# Patient Record
Sex: Female | Born: 1995 | Race: Black or African American | Hispanic: No | Marital: Single | Smoking: Never smoker
Health system: Southern US, Community
[De-identification: ages and names within clinical notes are randomized; demographics above are authoritative.]

## PROBLEM LIST (undated history)

## (undated) DIAGNOSIS — I10 Essential (primary) hypertension: Secondary | ICD-10-CM

---
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
## 2015-06-20 ENCOUNTER — Ambulatory Visit (INDEPENDENT_AMBULATORY_CARE_PROVIDER_SITE_OTHER): Payer: BLUE CROSS/BLUE SHIELD | Admitting: Physician Assistant

## 2015-06-20 VITALS — BP 126/74 | HR 81 | Temp 99.0°F | Resp 16 | Ht 69.0 in | Wt 305.0 lb

## 2015-06-20 DIAGNOSIS — J029 Acute pharyngitis, unspecified: Secondary | ICD-10-CM | POA: Diagnosis not present

## 2015-06-20 DIAGNOSIS — J069 Acute upper respiratory infection, unspecified: Secondary | ICD-10-CM | POA: Diagnosis not present

## 2015-06-20 LAB — POCT RAPID STREP A (OFFICE): Rapid Strep A Screen: NEGATIVE

## 2015-06-20 NOTE — Progress Notes (Signed)
[facility] [address], [place] [address] [phone]  Date:  06/20/2015   Name:  Christine Love   DOB:  [birth year]   MRN:  [id number]  PCP:  No PCP Per Patient    Chief Complaint: Sore Throat   History of Present Illness:  This is a 20 y.o. female who is presenting with "possible strep". Having sore throat and swollen lymph notes x 3 days. States she looked in the mirror this morning and had white patches on her right tonsil. Has had strep before but not very often. Denies nasal congestion, cough, fever, chills. Has been feeling nauseated.  Aggravating/alleviating factors: advil and helps the pain temporarily. History of asthma: yes but thinks she grew out of it. Hasn't had to use inhaler in over 2 years. History of env allergies: no Tobacco use: no Coworker was complaining of a sore throat recently. No other sick contacts.   Review of Systems:  Review of Systems See HPI   There are no active problems to display for this patient.   Prior to Admission medications   Medication Sig Start Date End Date Taking? Authorizing Provider  Etonogestrel (NEXPLANON [place]) Inject into the skin.   Yes Historical Provider, MD    No Known Allergies  History reviewed. No pertinent past surgical history.  Social History  Substance Use Topics  . Smoking status: Never Smoker   . Smokeless tobacco: None  . Alcohol Use: No    No family history on file.  Medication list has been reviewed and updated.  Physical Examination:  Physical Exam  Constitutional: She is oriented to person, place, and time. She appears well-developed and well-nourished. No distress.  HENT:  Head: Normocephalic and atraumatic.  Right Ear: Hearing, tympanic membrane, external ear and ear canal normal.  Left Ear: Hearing, external ear and ear canal normal.  Nose: Nose normal.  Mouth/Throat: Uvula is midline and mucous membranes are normal. Posterior oropharyngeal erythema present. No  oropharyngeal exudate, posterior oropharyngeal edema or tonsillar abscesses.  Left TM obstructed by cerumen Tonsil stone in right tonsil  Eyes: Conjunctivae and lids are normal. Right eye exhibits no discharge. Left eye exhibits no discharge. No scleral icterus.  Cardiovascular: Normal rate, regular rhythm, normal heart sounds and normal pulses.   No murmur heard. Pulmonary/Chest: Effort normal and breath sounds normal. No respiratory distress. She has no wheezes. She has no rhonchi. She has no rales.  Musculoskeletal: Normal range of motion.  Lymphadenopathy:       Head (right side): No submental, no submandibular and no tonsillar adenopathy present.       Head (left side): No submental, no submandibular and no tonsillar adenopathy present.    She has no cervical adenopathy.  Neurological: She is alert and oriented to person, place, and time.  Skin: Skin is warm, dry and intact. No lesion and no rash noted.  Psychiatric: She has a normal mood and affect. Her speech is normal and behavior is normal. Thought content normal.   BP 126/74 mmHg  Pulse 81  Temp(Src) 99 F (37.2 C)  Resp 16  Ht 5\' 9"  (1.753 m)  Wt 305 lb (138.347 kg)  BMI 45.02 kg/m2  SpO2 98%  Results for orders placed or performed in visit on 06/20/15  POCT rapid strep A  Result Value Ref Range   Rapid Strep A Screen Negative Negative   Assessment and Plan:  1. Viral URI 2. Sore throat Likely viral uri. Rapid strep negative. "white  patches" patient saw on tonsil likely a tonsil stone. Counseled on supportive care. Return in 1 week if symptoms do not improve or at any time if symptoms worsen.  - POCT rapid strep A - Culture, Group A Strep   [place] [employer]. Dyke Brackett, MHS [facility]  06/20/2015

## 2015-06-22 LAB — CULTURE, GROUP A STREP

## 2015-07-01 ENCOUNTER — Telehealth: Payer: Self-pay

## 2015-07-01 NOTE — Telephone Encounter (Signed)
Pt states she was diagnosed with strep and isn't getting any better, would like to speak with someone. Please call [phone]    [facility] [address]

## 2015-07-02 NOTE — Telephone Encounter (Signed)
Left message for pt to call back  °

## 2015-07-25 ENCOUNTER — Ambulatory Visit (INDEPENDENT_AMBULATORY_CARE_PROVIDER_SITE_OTHER): Payer: BLUE CROSS/BLUE SHIELD

## 2015-07-25 ENCOUNTER — Ambulatory Visit (INDEPENDENT_AMBULATORY_CARE_PROVIDER_SITE_OTHER): Payer: BLUE CROSS/BLUE SHIELD | Admitting: Physician Assistant

## 2015-07-25 VITALS — BP 138/88 | HR 72 | Temp 98.6°F | Resp 16 | Ht 69.0 in | Wt 305.8 lb

## 2015-07-25 DIAGNOSIS — R7611 Nonspecific reaction to tuberculin skin test without active tuberculosis: Secondary | ICD-10-CM

## 2015-07-25 NOTE — Patient Instructions (Addendum)
     IF you received an x-ray today, you will receive an invoice from [facility]. Please contact [facility] at [phone] with questions or concerns regarding your invoice.   IF you received labwork today, you will receive an invoice from [facility]. Please contact [facility] at [phone] with questions or concerns regarding your invoice.   Our billing staff will not be able to assist you with questions regarding bills from these companies.  You will be contacted with the lab results as soon as they are available. The fastest way to get your results is to activate your My Chart account. Instructions are located on the last page of this paperwork. If you have not heard from us regarding the results in 2 weeks, please contact this office.    Your CXR was normal.  Please let the practice that placed the PPD know that this was negative.  They will then decide what they would like to do at this time.

## 2015-07-25 NOTE — Progress Notes (Signed)
[facility] [address], [place] [address] [phone]  Date:  07/25/2015   Name:  Christine Love Grygiel   DOB:  19-Sep-1995   MRN:  [id number]  PCP:  No PCP Per Patient   Chief Complaint  Patient presents with  . Chest X-ray    for a positive TB test done at another clinic     History of Present Illness:  Christine Love Matherly is a 20 y.o. female patient who [facility].   Patient reports that she had a TB performed about 5 days ago, and advised to come here for an XR after positive reading by her health care employer of [employer].  She states that she has never had a positive TB.  She has never worked in a hospital, nursing home.  No living outside of the [place].  No exposure to a person with known TB.  She does exhibit some fatigue for years.  No unexplained cough or night sweats.  No hx of illegal drug use or transfusions.      There are no active problems to display for this patient.   Past Medical History  Diagnosis Date  . Hypertension     History reviewed. No pertinent past surgical history.  Social History  Substance Use Topics  . Smoking status: Never Smoker   . Smokeless tobacco: None  . Alcohol Use: No    History reviewed. No pertinent family history.  No Known Allergies  Medication list has been reviewed and updated.  Current Outpatient Prescriptions on File Prior to Visit  Medication Sig Dispense Refill  . Etonogestrel (NEXPLANON [place]) Inject into the skin.     No current facility-administered medications on file prior to visit.    ROS ROS otherwise unremarkable unless listed above.   Physical Examination: BP 138/88 mmHg  Pulse 72  Temp(Src) 98.6 F (37 C) (Oral)  Resp 16  Ht 5\' 9"  (1.753 m)  Wt 305 lb 12.8 oz (138.71 kg)  BMI 45.14 kg/m2  SpO2 98% Ideal Body Weight: Weight in (lb) to have BMI = 25: 168.9  Physical Exam  Constitutional: She is oriented to person, place, and time. She appears well-developed and  well-nourished. No distress.  HENT:  Head: Normocephalic and atraumatic.  Right Ear: External ear normal.  Left Ear: External ear normal.  Eyes: Conjunctivae and EOM are normal. Pupils are equal, round, and reactive to light.  Cardiovascular: Normal rate.   Pulmonary/Chest: Effort normal. No respiratory distress.  Neurological: She is alert and oriented to person, place, and time.  Skin: She is not diaphoretic.  Psychiatric: She has a normal mood and affect. Her behavior is normal.   Dg Chest 1 View  07/25/2015  CLINICAL DATA:  Positive PPD EXAM: CHEST 1 VIEW COMPARISON:  None. FINDINGS: The heart size and mediastinal contours are within normal limits. Both lungs are clear. The visualized skeletal structures are unremarkable. IMPRESSION: Normal frontal chest. Electronically Signed   By: Marnee SpringJonathon  Watts M.D.   On: 07/25/2015 15:30   Assessment and Plan: Christine Love Joerger is a 20 y.o. female who is here today for a positive TB reading. --she will submit this XR read to clinic that has performed this ppd.  i have instructed her that this may be cleared or they may [facility] for further eval or possible tx.  Positive PPD - Plan: DG Chest 1 View  Trena PlattStephanie English, PA-C [facility] 07/25/2015 3:02 PM

## 2015-12-31 ENCOUNTER — Ambulatory Visit: Payer: BLUE CROSS/BLUE SHIELD | Admitting: Family Medicine

## 2015-12-31 ENCOUNTER — Encounter: Payer: Self-pay | Admitting: Family Medicine

## 2015-12-31 VITALS — BP 118/84 | HR 105 | Temp 99.3°F | Resp 17 | Ht 69.0 in | Wt 300.0 lb

## 2015-12-31 DIAGNOSIS — B379 Candidiasis, unspecified: Secondary | ICD-10-CM

## 2015-12-31 DIAGNOSIS — F329 Major depressive disorder, single episode, unspecified: Secondary | ICD-10-CM

## 2015-12-31 DIAGNOSIS — F32A Depression, unspecified: Secondary | ICD-10-CM

## 2015-12-31 LAB — GLUCOSE, POCT (MANUAL RESULT ENTRY): POC Glucose: 141 mg/dl — AB (ref 70–99)

## 2015-12-31 MED ORDER — VENLAFAXINE HCL ER 37.5 MG PO CP24: ORAL_CAPSULE | ORAL | 0 refills | Status: DC

## 2015-12-31 NOTE — Patient Instructions (Addendum)
  I am starting you on a medicine called Effexor.  Take 1 pill a day for the next 3 days, then 2 pills at the same daily after that.  You should start to notice a difference in the next couple of days.  It takes 4-6 weeks to get the full effect.  You've already started the right things by coming in today and [facility] the [facility].    Come back in about 3 weeks to see how you're doing.  If you're starting to feel worse, or having thoughts about hurting yourself, don't wait and come back immediately or go to the ED.   IF you received an x-ray today, you will receive an invoice from [facility]. Please contact [facility] at [phone] with questions or concerns regarding your invoice.   IF you received labwork today, you will receive an invoice from [facility]. Please contact [facility] at [phone] with questions or concerns regarding your invoice.   Our billing staff will not be able to assist you with questions regarding bills from these companies.  You will be contacted with the lab results as soon as they are available. The fastest way to get your results is to activate your My Chart account. Instructions are located on the last page of this paperwork. If you have not heard from us regarding the results in 2 weeks, please contact this office.

## 2015-12-31 NOTE — Progress Notes (Addendum)
Christine Love is a 20 y.o. female who [facility] for depressoin:  1.  Depression:  Patient has longstanding history of the same.  Has noted for the past several weeks increasing symptoms of depression. She is having trouble sleeping at night. She is awaking with racing thoughts. She cries easily. There are times where she does not want to get out of bed. She started to miss classes. 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 Been with her mother who recommended she come in to be seen. Her mother recommended her to come in about a month ago and she tried to work through it on her own but was able to still feels daily symptoms of depression.  She was treated for depression as a child through middle school. She does not know the name of any of her medications. She is treated with counseling through middle school and high school. When she got to college she initially was doing better. She had a few episodes of depression during her freshman and sophomore year but was able to move through with these. However she feels that she has been unable to move her current episode.  She does have history of suicidal ideation without actual plan in the past. She has some passive death wish currently but no suicidal/homicidal ideations. No plan.  No history of mania now or in the past. No thyroid issues. She does state she has recurrent yeast infections for which she is treated at [facility]. She has been tested about a year and half ago for diabetes and was negative. However the yeast infections started in the past few months.  No polyuria, questionable polydipsia. Occasional nocturia.  No history of UTI/dysuria.    ROS as above.   PMH reviewed. Patient is a nonsmoker.   Past Medical History:  Diagnosis Date  . Anxiety   . Depression   . Irregular periods/menstrual cycles    No past surgical history on file.  Medications reviewed. Current  Outpatient Prescriptions  Medication Sig Dispense Refill  . ALPRAZolam (XANAX) 1 MG tablet Take 1 mg by mouth at bedtime as needed for anxiety.    Marland Kitchen amphetamine-dextroamphetamine (ADDERALL XR) 20 MG 24 hr capsule Take 20 mg by mouth 3 (three) times daily.     . Acetaminophen (TYLENOL PO) Take by mouth as needed.      . citalopram (CELEXA) 40 MG tablet Take 1 tablet (40 mg total) by mouth daily. (Patient not taking: Reported on 12/31/2015) 30 tablet 2  . HYDROcodone-acetaminophen (NORCO/VICODIN) 5-325 MG per tablet Take 1 tablet by mouth every 4 (four) hours as needed for pain. (Patient not taking: Reported on 12/31/2015) 20 tablet 0   No current facility-administered medications for this visit.      Physical Exam:  BP 110/60   Pulse 93   Temp 98.2 F (36.8 C) (Oral)   Resp 18   Ht 5\' 5"  (1.651 m)   Wt 149 lb 3.2 oz (67.7 kg)   LMP 12/17/2015   SpO2 100%   BMI 24.83 kg/m  Gen:  Alert, cooperative patient who appears stated age in no acute distress.  Vital signs reviewed. HEENT: EOMI,  MMM Pulm:  Clear to auscultation bilaterally with good air movement.  No wheezes or rales noted.   Cardiac:  Regular rate and rhythm without murmur auscultated.  Good S1/S2. Psych:  Flat affect. Cried throughout much of our visit.  No hallucinatoins.  No  SI/HI  Assessment and Plan:  1.  Depression: She has already [facility] She states her appointment is this coming Monday. She and her mother think she cannot wait until then and wanted to be seen by a doctor first. -We'll start her on Effexor 37.5 mg once a day for the next 3days and increase to 75 mg daiy after that. -Follow-up in about 2-3 weeks to ensure she is starting to notice some improvement with Effexor and that she is having any suicidal ideation. We can also see how she's doing with her counseling at that time - Encouraged her that she is doing the right thing by seeking treatment both with us and  counseling.    2.  Recurrent yeast infectoins: - none currently.  Checking for diabetes as this could also affect mood d/o.  - CBG was 141 today. She is not fasting and recently had something to eat about an hour ago. - Do not think she has diabetes.  No polyuria/polydipsia symptoms.

## 2016-02-13 ENCOUNTER — Other Ambulatory Visit: Payer: Self-pay | Admitting: Family Medicine

## 2016-03-17 ENCOUNTER — Telehealth: Payer: Self-pay

## 2016-03-17 NOTE — Telephone Encounter (Signed)
Patient called to request a copy of the venlafaxine prescription she got the last time she was here for documentation purposes for school.  Please advise  [phone]

## 2016-03-18 NOTE — Telephone Encounter (Signed)
Pt is calling a second time regarding a paper documentation for venlafaxine she was prescribed   Please advise [phone]

## 2016-03-19 NOTE — Telephone Encounter (Signed)
Spoke to patient. I gave a copy of ov note and snap shot FOR SCHOOL.

## 2016-04-20 ENCOUNTER — Telehealth: Payer: Self-pay

## 2016-04-20 NOTE — Telephone Encounter (Addendum)
PATIENT WAS SEEN IN September BY DR. Gwendolyn GrantWALDEN FOR DEPRESSION. SHE DROPPED OUT OF SCHOOL AND NOW SHE WILL BE GOING BACK. SHE CALLED TO GET A NOTE TO RETURN, BUT HER SCHOOL WOULD NOT EXCEPT AN ELECTRONIC SIGNATURE. SHE NEEDS THE NOTE TO SAY THAT SHE WAS SEEN FOR DEPRESSION AND WAS PRESCRIBED VENLAFAXINE 37.5 MG. SHE ALSO NEEDS IT TO SAY THAT IT IS ALRIGHT FOR HER TO RETURN SINCE BEING ON THE MEDICATION. ALSO SHE HAS NOTICED SOME THINGS WRONG ON HER AFTER VISIT SUMMARY THAT NEEDS TO BE CORRECTED. PLEASE CALL HER WHEN SHE CAN PICK THE NOTE UP. SHE NEEDS IT BEFORE SHE RETURNS TO SCHOOL ON April 26, 2016. SHE WILL ATTEND [employer]. (PATIENT MADE AN APPOINTMENT TO SEE DR. Gwendolyn GrantWALDEN ON Wednesday 04/21/2016.) BEST PHONE [phone] (CELL) MBC

## 2016-04-20 NOTE — Telephone Encounter (Signed)
Spoke with patient and she will address this letter at her visit tomorrow with Dr. Gwendolyn GrantWalden.

## 2016-04-21 ENCOUNTER — Ambulatory Visit (INDEPENDENT_AMBULATORY_CARE_PROVIDER_SITE_OTHER): Payer: BLUE CROSS/BLUE SHIELD | Admitting: Family Medicine

## 2016-04-21 VITALS — BP 142/77 | HR 82 | Temp 98.9°F | Resp 16 | Ht 69.0 in | Wt 301.0 lb

## 2016-04-21 DIAGNOSIS — F339 Major depressive disorder, recurrent, unspecified: Secondary | ICD-10-CM

## 2016-04-21 MED ORDER — VENLAFAXINE HCL ER 37.5 MG PO CP24: ORAL_CAPSULE | ORAL | 3 refills | Status: DC

## 2016-04-21 NOTE — Telephone Encounter (Signed)
I'll address at her visit today.  Thanks

## 2016-04-21 NOTE — Telephone Encounter (Signed)
Note written at visit today  See today's OV note.

## 2016-04-21 NOTE — Patient Instructions (Addendum)
It was good to see you again today!  I have refilled your medicine.  I have also written the letter for you.    Come back to see me in 4 - 6 weeks to see how you're doing after school starts.

## 2016-04-21 NOTE — Progress Notes (Signed)
   Christine Love is a 21 y.o. female who [facility] for FU for depression:  1.  Depression:  Patient was seen here in September for depression. She is a Consulting civil engineerstudent at [employer].  She was started on Effexor at that visit. She noted after several weeks of taking this a improvement in her mood disorder. She had more activity. She was walking and working out for exercise.  However she missed her follow-up appointment. She therefore had no more Effexor. Her mood disorder worsened. To the point where she actually dropped out of school for the second half of the semester. She was working and spending time with friends and family. This helped her mood. However she still has depressive symptoms on most days. She denies any suicidal or homicidal ideation. She has noted some gradual increase of her weight. This is somewhat stabilized because of her increased activity.  She would like to try to go back to school. She wants to be restarted on the venlaxafine. She is a program in place to start seeing counseling [facility].  ROS as above.    PMH reviewed. Patient is a nonsmoker.   Past Medical History:  Diagnosis Date  . Hypertension    No past surgical history on file.  Medications reviewed. Current Outpatient Prescriptions  Medication Sig Dispense Refill  . Etonogestrel (NEXPLANON [place]) Inject into the skin.    Marland Kitchen. venlafaxine XR (EFFEXOR XR) 37.5 MG 24 hr capsule Take 1 pill a day for the next 3 days, then 2 pills at the same daily after that. 60 capsule 0   No current facility-administered medications for this visit.      Physical Exam:  BP (!) 142/77 (BP Location: Right Arm, Patient Position: Sitting, Cuff Size: Large)   Pulse 82   Temp 98.9 F (37.2 C) (Oral)   Resp 16   Ht 5\' 9"  (1.753 m)   Wt (!) 301 lb (136.5 kg)   LMP 03/31/2016 (Approximate)   SpO2 100%   BMI 44.45 kg/m  Gen:  Alert, cooperative patient who appears stated age in no acute distress.  Vital  signs reviewed. Psych:  Somewhat flat affect. Somewhat depressed appearing. Linear and coherent thought process..   Assessment and Plan:  1.  Depression: -Refill for Effexor today. -I have written a note for herself that she can start school again. -Follow-up with us in 4-6 weeks to assess to see how she's doing. She should follow-up with her school counselor as previously planned.

## 2016-10-05 ENCOUNTER — Encounter (HOSPITAL_COMMUNITY): Payer: Self-pay | Admitting: Emergency Medicine

## 2016-10-05 ENCOUNTER — Emergency Department (HOSPITAL_COMMUNITY)
Admission: EM | Admit: 2016-10-05 | Discharge: 2016-10-05 | Disposition: A | Discharge status: Home / Self Care | Payer: BLUE CROSS/BLUE SHIELD | Attending: Emergency Medicine | Admitting: Emergency Medicine

## 2016-10-05 ENCOUNTER — Emergency Department (HOSPITAL_COMMUNITY): Payer: BLUE CROSS/BLUE SHIELD

## 2016-10-05 DIAGNOSIS — Y999 Unspecified external cause status: Secondary | ICD-10-CM | POA: Insufficient documentation

## 2016-10-05 DIAGNOSIS — S67194A Crushing injury of right ring finger, initial encounter: Secondary | ICD-10-CM | POA: Diagnosis not present

## 2016-10-05 DIAGNOSIS — W208XXA Other cause of strike by thrown, projected or falling object, initial encounter: Secondary | ICD-10-CM | POA: Insufficient documentation

## 2016-10-05 DIAGNOSIS — Y929 Unspecified place or not applicable: Secondary | ICD-10-CM | POA: Insufficient documentation

## 2016-10-05 DIAGNOSIS — S6991XA Unspecified injury of right wrist, hand and finger(s), initial encounter: Secondary | ICD-10-CM | POA: Diagnosis present

## 2016-10-05 DIAGNOSIS — S6710XA Crushing injury of unspecified finger(s), initial encounter: Secondary | ICD-10-CM

## 2016-10-05 DIAGNOSIS — Y939 Activity, unspecified: Secondary | ICD-10-CM | POA: Diagnosis not present

## 2016-10-05 DIAGNOSIS — I10 Essential (primary) hypertension: Secondary | ICD-10-CM | POA: Diagnosis not present

## 2016-10-05 NOTE — ED Notes (Signed)
Pt reports a can of cream of chicken fell on her R ring finger tonight.  Very mild swelling noted.  CMS intact

## 2016-10-05 NOTE — Discharge Instructions (Signed)
Ibuprofen or Tylenol for pain. Keep the splint on for 1-2 weeks as needed. Ice and elevate your fingers. Follow-up with family doctor as needed.

## 2016-10-05 NOTE — ED Provider Notes (Signed)
[facility]-EMERGENCY DEPT Provider Note   CSN: [id number] Arrival date & time: 10/05/16  1907     History   Chief Complaint Chief Complaint  Patient presents with  . Finger Injury    HPI Cintia Alexa is a 21 y.o. female.  HPI Eston EstersChyna Reisz is a 21 y.o. female presents to emergency public complaining of finger injury. Patient states she was getting some cans out of her cabinet when one fell hitting her in the right Ring finger. She reports pain with flexion and extension of the finger. She denies any numbness distally. No weakness with flexion extension. States it was swollen, but swelling has gone down. Denies any other injuries.  Past Medical History:  Diagnosis Date  . Hypertension     There are no active problems to display for this patient.   History reviewed. No pertinent surgical history.  OB History    No data available       Home Medications    Prior to Admission medications   Medication Sig Start Date End Date Taking? Authorizing Provider  Etonogestrel (NEXPLANON [place]) Inject into the skin.    [provider]  venlafaxine XR (EFFEXOR XR) 37.5 MG 24 hr capsule Take 1 tab po bid 04/21/16   Tobey GrimWalden, Jeffrey H, MD    Family History Family History  Problem Relation Age of Onset  . Hypertension Other     Social History Social History  Substance Use Topics  . Smoking status: Never Smoker  . Smokeless tobacco: Never Used  . Alcohol use Yes     Comment: social     Allergies   Patient has no known allergies.   Review of Systems Review of Systems  Musculoskeletal: Positive for arthralgias and joint swelling.  Neurological: Negative for weakness and numbness.  All other systems reviewed and are negative.    Physical Exam Updated Vital Signs BP 137/82 (BP Location: Left Arm)   Pulse 84   Temp 99.3 F (37.4 C) (Oral)   Resp 16   Ht 5\' 9"  (1.753 m)   Wt 136.1 kg (300 lb)   LMP 09/26/2016 (Approximate)   SpO2 99%   BMI 44.30 kg/m   Physical  Exam  Musculoskeletal:  Swelling noted to the right ring finger at the PIP joint. Full range of motion of the finger at each joint. Strength intact with flexion extension at each joint isolated. Distal cap refill is less than 2 seconds. Sensation is intact. Normal hand otherwise.  Nursing note and vitals reviewed.    ED Treatments / Results  Labs (all labs ordered are listed, but only abnormal results are displayed) Labs Reviewed - No data to display  EKG  EKG Interpretation None       Radiology Dg Finger Ring Right  Result Date: 10/05/2016 CLINICAL DATA:  21 y/o F; initial encounter. Crush injury with pain. EXAM: RIGHT RING FINGER 2+V COMPARISON:  None. FINDINGS: There is no evidence of fracture or dislocation. There is no evidence of arthropathy or other focal bone abnormality. Soft tissues are unremarkable. IMPRESSION: Negative. Electronically Signed   By: Mitzi HansenLance  Furusawa-Stratton M.D.   On: 10/05/2016 19:42    Procedures Procedures (including critical care time)  Medications Ordered in ED Medications - No data to display   Initial Impression / Assessment and Plan / ED Course  I have reviewed the triage vital signs and the nursing notes.  Pertinent labs & imaging results that were available during my care of the patient were reviewed by me  and considered in my medical decision making (see chart for details).     Patient with crush injury of the right fourth finger. X-ray negative. Finger splinted. Home with ice, elevation, ibuprofen, splint as needed, follow with family doctor. Return precautions discussed.  Vitals:   10/05/16 1919  BP: 137/82  Pulse: 84  Resp: 16  Temp: 99.3 F (37.4 C)     Final Clinical Impressions(s) / ED Diagnoses   Final diagnoses:  Crushing injury of finger, initial encounter    New Prescriptions New Prescriptions   No medications on file     Jaynie Crumble, Cordelia Poche 10/05/16 2134    Doug Sou, MD 10/06/16 [phone]

## 2016-10-05 NOTE — ED Triage Notes (Signed)
Pt states she opened a cabinet and a can of food fell out on her right ring finger Pt is c/o pain and states it hurts to try to straighten it

## 2016-10-05 NOTE — ED Notes (Signed)
Pt left without her d/c instructions and without signing.

## 2016-10-05 NOTE — ED Notes (Signed)
Splint and coban placed

## 2016-10-25 ENCOUNTER — Ambulatory Visit: Payer: BLUE CROSS/BLUE SHIELD | Admitting: Physician Assistant

## 2016-10-26 ENCOUNTER — Encounter: Payer: Self-pay | Admitting: Physician Assistant

## 2016-10-26 ENCOUNTER — Ambulatory Visit (INDEPENDENT_AMBULATORY_CARE_PROVIDER_SITE_OTHER): Payer: BLUE CROSS/BLUE SHIELD | Admitting: Physician Assistant

## 2016-10-26 VITALS — BP 134/83 | HR 76 | Temp 98.8°F | Resp 18 | Ht 69.57 in | Wt 303.2 lb

## 2016-10-26 DIAGNOSIS — H6122 Impacted cerumen, left ear: Secondary | ICD-10-CM

## 2016-10-26 NOTE — Patient Instructions (Signed)
pc    IF you received an x-ray today, you will receive an invoice from [facility]. Please contact [facility] at [phone] with questions or concerns regarding your invoice.   IF you received labwork today, you will receive an invoice from [facility]. Please contact [facility] at [phone] with questions or concerns regarding your invoice.   Our billing staff will not be able to assist you with questions regarding bills from these companies.  You will be contacted with the lab results as soon as they are available. The fastest way to get your results is to activate your My Chart account. Instructions are located on the last page of this paperwork. If you have not heard from us regarding the results in 2 weeks, please contact this office.     

## 2016-10-29 NOTE — Progress Notes (Signed)
[facility] [address], [place] [phone]  Date:  10/26/2016   Name:  Christine Love   DOB:  1995-10-16   MRN:  [id number]  PCP:  Patient, No Pcp Per    History of Present Illness:  Christine Love is a 21 y.o. female patient who presents to PCP with  Chief Complaint  Patient presents with  . Ear Pain    X 1 week- left ear     Patient notes 1 week of left ear pain. This has sensation of clogged and hard to hear.  She attempted to clean it out with her nail, but her symptoms worsened.    There are no active problems to display for this patient.   Past Medical History:  Diagnosis Date  . Hypertension     History reviewed. No pertinent surgical history.  Social History  Substance Use Topics  . Smoking status: Never Smoker  . Smokeless tobacco: Never Used  . Alcohol use Yes     Comment: social    Family History  Problem Relation Age of Onset  . Hypertension Other   . Hypertension Mother     No Known Allergies  Medication list has been reviewed and updated.  Current Outpatient Prescriptions on File Prior to Visit  Medication Sig Dispense Refill  . Etonogestrel (NEXPLANON [place]) Inject into the skin.    Marland Kitchen. venlafaxine XR (EFFEXOR XR) 37.5 MG 24 hr capsule Take 1 tab po bid 60 capsule 3   No current facility-administered medications on file prior to visit.     ROS ROS otherwise unremarkable unless listed above.  Physical Examination: BP 134/83 (BP Location: Right Arm, Patient Position: Sitting, Cuff Size: Large)   Pulse 76   Temp 98.8 F (37.1 C) (Oral)   Resp 18   Ht 5' 9.57" (1.767 m)   Wt (!) 303 lb 3.2 oz (137.5 kg)   LMP 09/26/2016 (Approximate)   SpO2 99%   BMI 44.05 kg/m  Ideal Body Weight: Weight in (lb) to have BMI = 25: 171.7  Physical Exam  Constitutional: She is oriented to person, place, and time. She appears well-developed and well-nourished. No distress.  HENT:  Right Ear: External ear normal.  Left Ear:  External ear normal.  Canal without erythema.  Tm blocked from visualization due to cerumen impaction. Once irrigation, tm is normal  Cardiovascular: Normal rate.   Pulmonary/Chest: Effort normal. No respiratory distress.  Neurological: She is alert and oriented to person, place, and time.  Skin: She is not diaphoretic.  Psychiatric: She has a normal mood and affect. Her behavior is normal.     Assessment and Plan: Christine EstersChyna Mchargue is a 21 y.o. female who is here today f0r cc of cerumen impaction Impacted cerumen of left ear  Trena PlattStephanie English, PA-C [facility] 7/13/20188:43 AM

## 2017-06-24 ENCOUNTER — Ambulatory Visit: Payer: BLUE CROSS/BLUE SHIELD | Admitting: Family Medicine

## 2017-07-19 ENCOUNTER — Encounter: Payer: Self-pay | Admitting: Physician Assistant

## 2017-09-02 ENCOUNTER — Encounter: Payer: Self-pay | Admitting: Family Medicine

## 2017-09-07 ENCOUNTER — Encounter: Payer: Self-pay | Admitting: Family Medicine

## 2018-07-21 IMAGING — CR DG FINGER RING 2+V*R*
3 series · 3 of 3 positions shown · non-contrast
Comparison: None.

CLINICAL DATA: 21 y/o F; initial encounter. Crush injury with pain.

EXAM:
RIGHT RING FINGER 2+V

[x finger pa right]
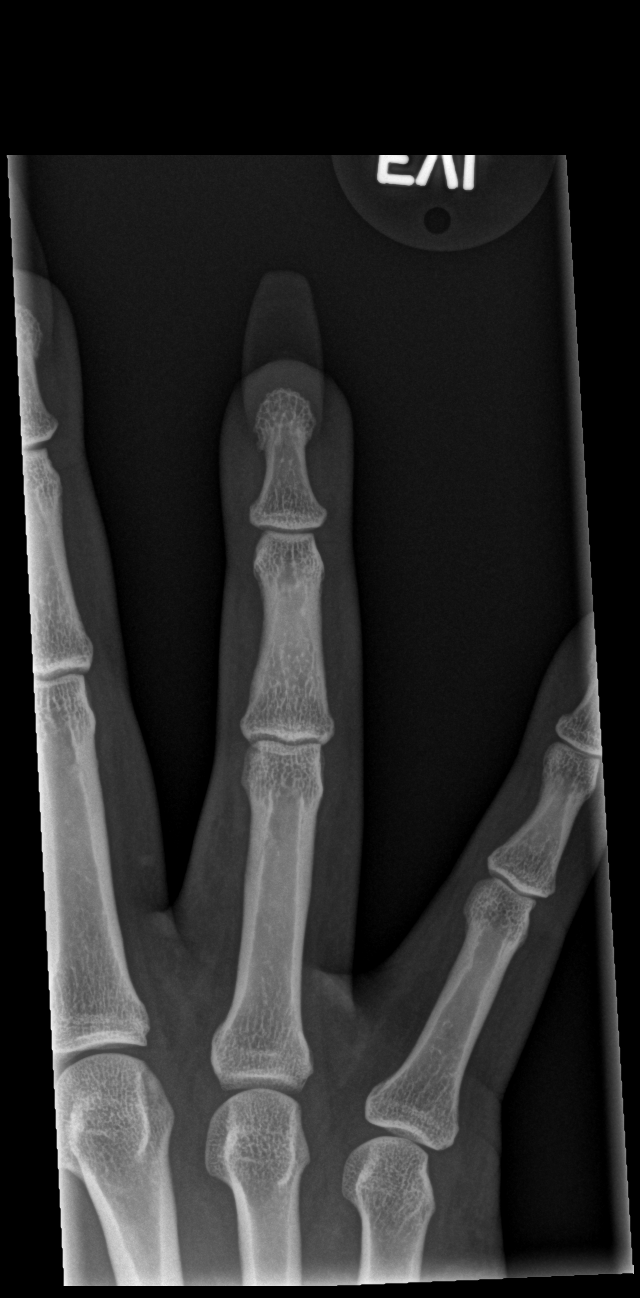

[x finger obl right]
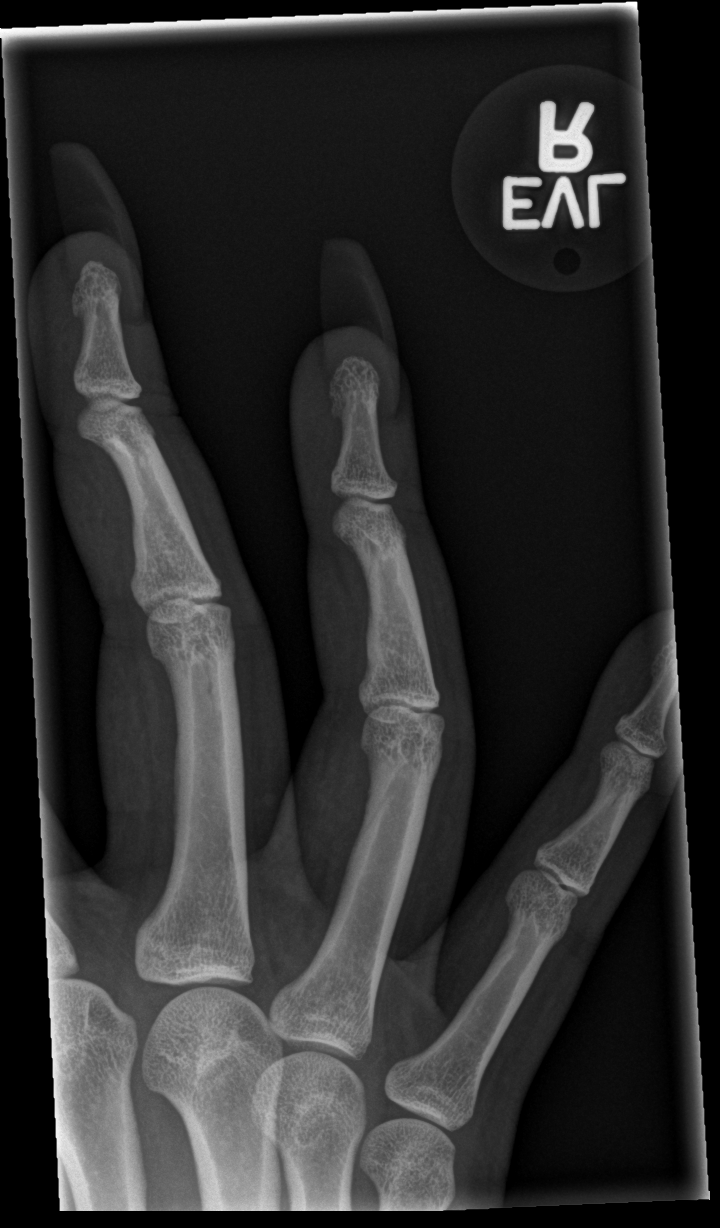

[x finger lat right]
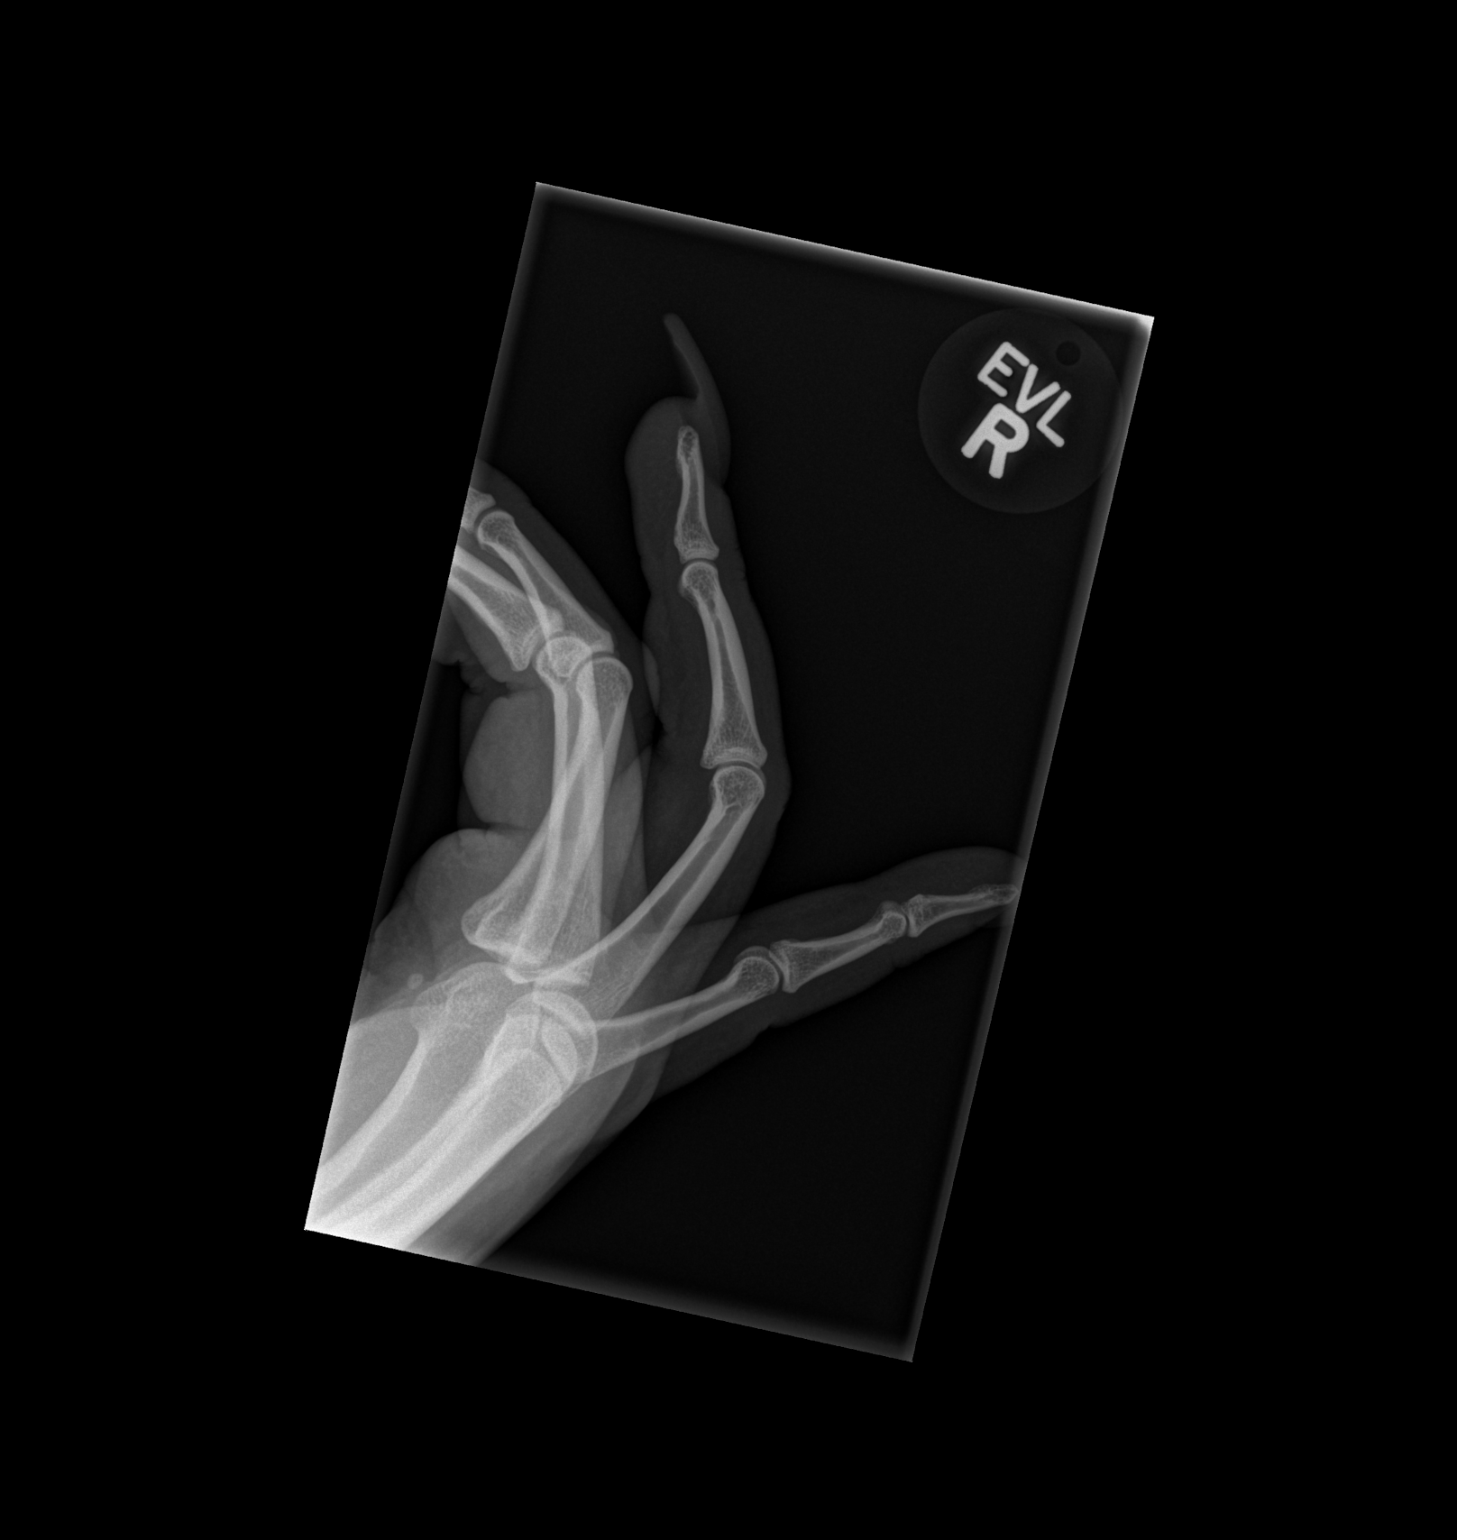

[3 of 3 positions shown; findings below may reference images not displayed]

FINDINGS: There is no evidence of fracture or dislocation. There is no
evidence of arthropathy or other focal bone abnormality. Soft
tissues are unremarkable.
IMPRESSION: Negative.

By: Shazia Maldonado M.D.

## 2018-10-15 ENCOUNTER — Encounter (HOSPITAL_COMMUNITY): Payer: Self-pay

## 2018-10-15 ENCOUNTER — Ambulatory Visit (HOSPITAL_COMMUNITY)
Admission: EM | Admit: 2018-10-15 | Discharge: 2018-10-15 | Disposition: A | Discharge status: Home / Self Care | Payer: BC Managed Care – PPO | Attending: Emergency Medicine | Admitting: Emergency Medicine

## 2018-10-15 DIAGNOSIS — M25571 Pain in right ankle and joints of right foot: Secondary | ICD-10-CM | POA: Diagnosis not present

## 2018-10-15 MED ORDER — IBUPROFEN 800 MG PO TABS: 800.0000 mg | ORAL_TABLET | Freq: Three times a day (TID) | ORAL | 0 refills | Status: DC

## 2018-10-15 NOTE — ED Triage Notes (Signed)
Pt C/O right foot pain, pt states she woke up at 6 am an could not bare any weight on her right foot.  Pt states she was able to stretch her foot out and begin to get ready for work. While at work the foot pain came back and she was unable to continue to work or walk .

## 2018-10-15 NOTE — Discharge Instructions (Signed)
Use anti-inflammatories for pain/swelling. You may take up to 800 mg Ibuprofen every 8 hours with food. You may supplement Ibuprofen with Tylenol [phone] mg every 8 hours.   Ice and Elevate  Wear ankle brace, slowly transition out of crutches

## 2018-10-16 NOTE — ED Provider Notes (Signed)
[facility]    CSN: [id number] Arrival date & time: 10/15/18  1613      History   Chief Complaint Chief Complaint  Patient presents with   Foot Pain    Right foot     HPI Christine Love is a 23 y.o. female history of hypertension presenting today for evaluation of right foot/ankle pain.  Patient states that this morning when she woke up she had pain and the outer aspect of her right foot.  She denies any injury, increase in activity or awkward planting of her foot.  States that she attempted stretching out her foot, went to work, but pain gradually worsened throughout the day to the point where she has been unable to walk.  She denies any changes to the skin.  Denies previous fractures.  HPI  Past Medical History:  Diagnosis Date   Hypertension     There are no active problems to display for this patient.   History reviewed. No pertinent surgical history.  OB History   No obstetric history on file.      Home Medications    Prior to Admission medications   Medication Sig Start Date End Date Taking? Authorizing Provider  Etonogestrel (NEXPLANON [place]) Inject into the skin.    [provider]  ibuprofen (ADVIL) 800 MG tablet Take 1 tablet (800 mg total) by mouth 3 (three) times daily. 10/15/18   Migel Hannis C, PA-C  venlafaxine XR (EFFEXOR XR) 37.5 MG 24 hr capsule Take 1 tab po bid 04/21/16   Tobey GrimWalden, Jeffrey H, MD    Family History Family History  Problem Relation Age of Onset   Hypertension Other    Hypertension Mother     Social History Social History   Tobacco Use   Smoking status: Never Smoker   Smokeless tobacco: Never Used  Substance Use Topics   Alcohol use: Yes    Comment: social   Drug use: No     Allergies   Patient has no known allergies.   Review of Systems Review of Systems  Constitutional: Negative for fatigue and fever.  HENT: Negative for mouth sores.   Eyes: Negative for visual disturbance.    Respiratory: Negative for shortness of breath.   Cardiovascular: Negative for chest pain.  Gastrointestinal: Negative for abdominal pain, nausea and vomiting.  Musculoskeletal: Positive for arthralgias and joint swelling.  Skin: Negative for color change and rash.  Neurological: Negative for dizziness, weakness, light-headedness and headaches.     Physical Exam Triage Vital Signs ED Triage Vitals  Enc Vitals Group     BP 10/15/18 1633 124/81     Pulse Rate 10/15/18 1633 93     Resp 10/15/18 1633 18     Temp 10/15/18 1633 98.6 F (37 C)     Temp Source 10/15/18 1633 Oral     SpO2 10/15/18 1633 99 %     Weight --      Height --      Head Circumference --      Peak Flow --      Pain Score 10/15/18 1636 9     Pain Loc --      Pain Edu? --      Excl. in GC? --    No data found.  Updated Vital Signs BP 124/81 (BP Location: Left Arm)    Pulse 93    Temp 98.6 F (37 C) (Oral)    Resp 18    LMP 09/14/2018  SpO2 99%   Visual Acuity Right Eye Distance:   Left Eye Distance:   Bilateral Distance:    Right Eye Near:   Left Eye Near:    Bilateral Near:     Physical Exam Vitals signs and nursing note reviewed.  Constitutional:      Appearance: She is well-developed.     Comments: No acute distress  HENT:     Head: Normocephalic and atraumatic.     Nose: Nose normal.  Eyes:     Conjunctiva/sclera: Conjunctivae normal.  Neck:     Musculoskeletal: Neck supple.  Cardiovascular:     Rate and Rhythm: Normal rate.  Pulmonary:     Effort: Pulmonary effort is normal. No respiratory distress.  Abdominal:     General: There is no distension.  Musculoskeletal: Normal range of motion.     Comments: Right ankle: No obvious swelling or deformity, no erythema or changes in color noted, nontender to palpation of lateral and medial malleolus, nontender posteriorly along Achilles, nontender throughout dorsum of foot or plantar surface.  Patient points to lateral aspect of foot as  source of pain with weightbearing. Dorsalis pedis 2+  Skin:    General: Skin is warm and dry.  Neurological:     Mental Status: She is alert and oriented to person, place, and time.      UC Treatments / Results  Labs (all labs ordered are listed, but only abnormal results are displayed) Labs Reviewed - No data to display  EKG None  Radiology No results found.  Procedures Procedures (including critical care time)  Medications Ordered in UC Medications - No data to display  Initial Impression / Assessment and Plan / UC Course  I have reviewed the triage vital signs and the nursing notes.  Pertinent labs & imaging results that were available during my care of the patient were reviewed by me and considered in my medical decision making (see chart for details).     Right foot pain, no mechanism of injury, nontender to touch, do not suspect underlying bony abnormality.  Most likely sprain.  Will provide ASO and crutches for patient's comfort.  Ice and elevate and anti-inflammatories.Discussed strict return precautions. Patient verbalized understanding and is agreeable with plan.  Final Clinical Impressions(s) / UC Diagnoses   Final diagnoses:  Acute right ankle pain     Discharge Instructions     Use anti-inflammatories for pain/swelling. You may take up to 800 mg Ibuprofen every 8 hours with food. You may supplement Ibuprofen with Tylenol [phone] mg every 8 hours.   Ice and Elevate  Wear ankle brace, slowly transition out of crutches   ED Prescriptions    Medication Sig Dispense Auth. Provider   ibuprofen (ADVIL) 800 MG tablet Take 1 tablet (800 mg total) by mouth 3 (three) times daily. 21 tablet Ariv Penrod, [place] C, PA-C     Controlled Substance Prescriptions [place] Controlled Substance Registry consulted? Not Applicable   Janith Lima, [place] 10/16/18 [phone]

## 2018-10-19 ENCOUNTER — Telehealth: Payer: Self-pay | Admitting: General Practice

## 2018-10-20 ENCOUNTER — Telehealth: Payer: Self-pay | Admitting: Family Medicine

## 2018-10-20 NOTE — Telephone Encounter (Signed)
Left VM to schedule a Virtual visit for med refills. Will also need a TOC with another provider

## 2018-10-23 ENCOUNTER — Ambulatory Visit (INDEPENDENT_AMBULATORY_CARE_PROVIDER_SITE_OTHER)
Admission: RE | Admit: 2018-10-23 | Discharge: 2018-10-23 | Disposition: A | Discharge status: Home / Self Care | Payer: BC Managed Care – PPO | Source: Ambulatory Visit

## 2018-10-23 DIAGNOSIS — F32A Depression, unspecified: Secondary | ICD-10-CM

## 2018-10-23 DIAGNOSIS — F329 Major depressive disorder, single episode, unspecified: Secondary | ICD-10-CM

## 2018-10-23 MED ORDER — VENLAFAXINE HCL ER 37.5 MG PO CP24: ORAL_CAPSULE | ORAL | 3 refills | Status: AC

## 2018-10-23 NOTE — Discharge Instructions (Addendum)
Take the medication as prescribed. It can take a few weeks before you see  improvement in symptoms. If you develop suicidal thoughts you need to go the hospital I am giving you a contact for primary care provider

## 2018-10-23 NOTE — ED Provider Notes (Signed)
Virtual Visit via Video Note:  [name]  initiated request for Telemedicine visit with [facility] team. I connected with [name]  on 10/23/2018 at 11:10 AM  for a synchronized telemedicine visit using a video enabled HIPPA compliant telemedicine application. I verified that I am speaking with [name]  using two identifiers. Orvan July, NP  was physically located in a [facility] site and [facility] was located at a different location.   The limitations of evaluation and management by telemedicine as well as the availability of in-person appointments were discussed. Patient was informed that she  may incur a bill ( including co-pay) for this virtual visit encounter. [name]  expressed understanding and gave verbal consent to proceed with virtual visit.     History of Present Illness:Christine Love  is a 23 y.o. female presents with needing a refill on her Effexor.  She has not taken this medication in over a year.  Reporting was on it for 2 to 3 months due to stress and my related depression.  She has had a recent death in the family and stress at home that has left her anxious and depressed.  She is also started back school in the fall which causes her anxiety.  Denies any suicidal ideations.  She would like to start back on the Effexor.  She does not currently have a primary care provider.   Past Medical History:  Diagnosis Date  . Hypertension     No Known Allergies      Observations/Objective:GENERAL APPEARANCE: Well developed, well nourished, alert and cooperative, and appears to be in no acute distress. HEAD: normocephalic. Non labored breathing, no dyspnea or distress Skin: Skin normal color  PSYCHIATRIC: The mental examination revealed the patient was oriented to person, place, and time. The patient was able to demonstrate good judgement and reason, without hallucinations, abnormal affect or abnormal behaviors during the examination. Patient  is not suicidal     Assessment and Plan: Restarting patient's Effexor Gave contact for primary care provider for follow-up and maintenance of medications.   Follow Up Instructions:Follow up as needed for continued or worsening symptoms     I discussed the assessment and treatment plan with the patient. The patient was provided an opportunity to ask questions and all were answered. The patient agreed with the plan and demonstrated an understanding of the instructions.   The patient was advised to call back or seek an in-person evaluation if the symptoms worsen or if the condition fails to improve as anticipated.    Orvan July, NP  10/23/2018 11:10 AM         Orvan July, NP 10/23/18 1458

## 2019-02-06 ENCOUNTER — Encounter (HOSPITAL_COMMUNITY): Payer: Self-pay

## 2019-02-06 ENCOUNTER — Ambulatory Visit (HOSPITAL_COMMUNITY)
Admission: EM | Admit: 2019-02-06 | Discharge: 2019-02-06 | Disposition: A | Discharge status: Home / Self Care | Payer: BC Managed Care – PPO | Attending: Family Medicine | Admitting: Family Medicine

## 2019-02-06 DIAGNOSIS — Z3202 Encounter for pregnancy test, result negative: Secondary | ICD-10-CM | POA: Diagnosis not present

## 2019-02-06 DIAGNOSIS — R1030 Lower abdominal pain, unspecified: Secondary | ICD-10-CM

## 2019-02-06 DIAGNOSIS — R102 Pelvic and perineal pain: Secondary | ICD-10-CM

## 2019-02-06 LAB — POCT URINALYSIS DIP (DEVICE)
Bilirubin Urine: NEGATIVE
Glucose, UA: NEGATIVE mg/dL
Hgb urine dipstick: NEGATIVE
Ketones, ur: NEGATIVE mg/dL
Leukocytes,Ua: NEGATIVE
Nitrite: NEGATIVE
Protein, ur: NEGATIVE mg/dL
Specific Gravity, Urine: 1.025 (ref 1.005–1.030)
Urobilinogen, UA: 0.2 mg/dL (ref 0.0–1.0)
pH: 7.5 (ref 5.0–8.0)

## 2019-02-06 LAB — POCT PREGNANCY, URINE: Preg Test, Ur: NEGATIVE

## 2019-02-06 MED ORDER — IBUPROFEN 800 MG PO TABS: 800.0000 mg | ORAL_TABLET | Freq: Three times a day (TID) | ORAL | 0 refills | Status: AC

## 2019-02-06 MED ORDER — AZITHROMYCIN 250 MG PO TABS
1000.0000 mg | ORAL_TABLET | Freq: Once | ORAL | Status: AC
  Administered 2019-02-06: 1000 mg via ORAL

## 2019-02-06 MED ORDER — CEFTRIAXONE SODIUM 250 MG IJ SOLR
INTRAMUSCULAR | Status: AC
  Filled 2019-02-06: qty 250

## 2019-02-06 MED ORDER — AZITHROMYCIN 250 MG PO TABS
ORAL_TABLET | ORAL | Status: AC
  Filled 2019-02-06: qty 4

## 2019-02-06 MED ORDER — CEFTRIAXONE SODIUM 250 MG IJ SOLR
250.0000 mg | Freq: Once | INTRAMUSCULAR | Status: AC
  Administered 2019-02-06: 14:00:00 250 mg via INTRAMUSCULAR

## 2019-02-06 NOTE — ED Provider Notes (Signed)
MRN: [id number] DOB: 1995-06-30  Subjective:   Christine Love is a 23 y.o. female presenting for 3 day hx of intermittent lower abdominal/pelvic pain ranging between moderate-severe, was at its worst last night. Has gotten relief with ibuprofen. LMP ended on 01/23/2019, was shorter than normal. Denies having difficulty with her cycles. Patient is sexually active, does not use condoms for protection. Has a hx of BV.    No current facility-administered medications for this encounter.   Current Outpatient Medications:  .  ibuprofen (ADVIL) 800 MG tablet, Take 1 tablet (800 mg total) by mouth 3 (three) times daily., Disp: 21 tablet, Rfl: 0 .  venlafaxine XR (EFFEXOR XR) 37.5 MG 24 hr capsule, Take 1 tab po bid, Disp: 60 capsule, Rfl: 3    No Known Allergies   Past Medical History:  Diagnosis Date  . Hypertension      History reviewed. No pertinent surgical history.   Review of Systems  Constitutional: Negative for chills and fever.  Respiratory: Negative for shortness of breath.   Cardiovascular: Negative for chest pain.  Gastrointestinal: Positive for abdominal pain. Negative for blood in stool, constipation, diarrhea, nausea and vomiting.  Genitourinary: Negative for dysuria, flank pain, frequency, hematuria and urgency.  Musculoskeletal: Negative for myalgias.  Skin: Negative for rash.  Neurological: Negative for dizziness and headaches.    Objective:   Vitals: BP 127/82   Pulse 82   Temp 97.8 F (36.6 C)   Resp 18   LMP 01/18/2019   SpO2 99%   Physical Exam Constitutional:      General: She is not in acute distress.    Appearance: Normal appearance. She is well-developed. She is obese. She is not ill-appearing, toxic-appearing or diaphoretic.  HENT:     Head: Normocephalic and atraumatic.     Right Ear: External ear normal.     Left Ear: External ear normal.     Nose: Nose normal.     Mouth/Throat:     Mouth: Mucous membranes are moist.     Pharynx: Oropharynx  is clear.  Eyes:     General: No scleral icterus.    Extraocular Movements: Extraocular movements intact.     Pupils: Pupils are equal, round, and reactive to light.  Cardiovascular:     Rate and Rhythm: Normal rate and regular rhythm.     Heart sounds: Normal heart sounds. No murmur. No friction rub. No gallop.   Pulmonary:     Effort: Pulmonary effort is normal. No respiratory distress.     Breath sounds: Normal breath sounds. No stridor. No wheezing, rhonchi or rales.  Abdominal:     General: Bowel sounds are normal. There is no distension.     Palpations: Abdomen is soft. There is no mass.     Tenderness: There is abdominal tenderness (mild, patient tolerated exam very well) in the suprapubic area. There is no right CVA tenderness, left CVA tenderness, guarding or rebound.  Skin:    General: Skin is warm and dry.     Coloration: Skin is not pale.     Findings: No rash.  Neurological:     General: No focal deficit present.     Mental Status: She is alert and oriented to person, place, and time.  Psychiatric:        Mood and Affect: Mood normal.        Behavior: Behavior normal.        Thought Content: Thought content normal.  Judgment: Judgment normal.     Results for orders placed or performed during the hospital encounter of 02/06/19 (from the past 24 hour(s))  POCT urinalysis dip (device)     Status: None   Collection Time: 02/06/19  1:14 PM  Result Value Ref Range   Glucose, UA NEGATIVE NEGATIVE mg/dL   Bilirubin Urine NEGATIVE NEGATIVE   Ketones, ur NEGATIVE NEGATIVE mg/dL   Specific Gravity, Urine 1.025 1.005 - 1.030   Hgb urine dipstick NEGATIVE NEGATIVE   pH 7.5 5.0 - 8.0   Protein, ur NEGATIVE NEGATIVE mg/dL   Urobilinogen, UA 0.2 0.0 - 1.0 mg/dL   Nitrite NEGATIVE NEGATIVE   Leukocytes,Ua NEGATIVE NEGATIVE  Pregnancy, urine POC     Status: None   Collection Time: 02/06/19  1:18 PM  Result Value Ref Range   Preg Test, Ur NEGATIVE NEGATIVE     Assessment and Plan :   1. Pelvic pain in female   2. Lower abdominal pain     We will treat empirically per CDC guidelines for gonorrhea and chlamydia with IM ceftriaxone and azithromycin in clinic.  Use ibuprofen as needed for pain.  Recommended she recheck with her gynecologist to see about obtaining a pelvic/transvaginal ultrasound to rule out ovarian cyst versus uterine fibroid versus other source of her pelvic pain.  Counseled on safe sex practices including abstaining for 1 week following treatment.  Counseled patient on potential for adverse effects with medications prescribed/recommended today, ER and return-to-clinic precautions discussed, patient verbalized understanding.    Jaynee Eagles, PA-C 02/06/19 1341

## 2019-02-06 NOTE — ED Triage Notes (Signed)
Pt presents with complaints of lower abdominal pain that resembles very bad period cramps that started on Saturday. Reports taking ibuprofen with some relief. The patient states last night it was severe.

## 2019-02-07 LAB — URINE CULTURE: Culture: <10000 — AB

## 2019-02-15 LAB — CERVICOVAGINAL ANCILLARY ONLY
Bacterial vaginitis: POSITIVE — AB
Candida vaginitis: NEGATIVE
Chlamydia: NEGATIVE
Neisseria Gonorrhea: NEGATIVE
Trichomonas: NEGATIVE

## 2019-02-16 ENCOUNTER — Telehealth (HOSPITAL_COMMUNITY): Payer: Self-pay | Admitting: Emergency Medicine

## 2019-02-16 ENCOUNTER — Encounter (HOSPITAL_COMMUNITY): Payer: Self-pay

## 2019-02-16 MED ORDER — METRONIDAZOLE 500 MG PO TABS: 500.0000 mg | ORAL_TABLET | Freq: Two times a day (BID) | ORAL | 0 refills | Status: AC

## 2019-02-16 NOTE — Telephone Encounter (Signed)
Bacterial vaginosis is positive. This was not treated at the urgent care visit.  Flagyl 500 mg BID x 7 days #14 no refills sent to patients pharmacy of choice.    Attempted to reach patient. No answer at this time. Call cannot be completed. Mychart message sent.

## 2019-04-05 ENCOUNTER — Inpatient Hospital Stay: Admission: RE | Admit: 2019-04-05 | Payer: BC Managed Care – PPO | Source: Ambulatory Visit

## 2019-04-05 ENCOUNTER — Telehealth: Payer: BC Managed Care – PPO

## 2019-04-06 ENCOUNTER — Inpatient Hospital Stay: Admission: RE | Admit: 2019-04-06 | Payer: BC Managed Care – PPO | Source: Ambulatory Visit

## 2019-04-09 ENCOUNTER — Ambulatory Visit (INDEPENDENT_AMBULATORY_CARE_PROVIDER_SITE_OTHER)
Admission: RE | Admit: 2019-04-09 | Discharge: 2019-04-09 | Disposition: A | Discharge status: Home / Self Care | Payer: BC Managed Care – PPO | Source: Ambulatory Visit

## 2019-04-09 DIAGNOSIS — Z0289 Encounter for other administrative examinations: Secondary | ICD-10-CM

## 2019-04-09 DIAGNOSIS — Z1331 Encounter for screening for depression: Secondary | ICD-10-CM

## 2019-04-09 NOTE — ED Provider Notes (Addendum)
Virtual Visit via Video Note:  Christine Love  initiated request for Telemedicine visit with [facility] team. I connected with Christine Love  on 04/09/2019 at 11:34 AM  for [place] synchronized telemedicine visit using [place] video enabled HIPPA compliant telemedicine application. I verified that I am speaking with Christine [place]  using two identifiers. Christine July, NP  was physically located in [facility] [place] [facility] site and [facility] was located at [place] different location.   The limitations of evaluation and management by telemedicine as well as the availability of in-person appointments were discussed. Patient was informed that she  may incur [place] bill ( including co-pay) for this virtual visit encounter. Christine Love  expressed understanding and gave verbal consent to proceed with virtual visit.     History of Present Illness:Christine Love  is Love 23 y.o. female presents with wanting Love note to return to school in the spring.  I spoke with her back in [place] due to having some depression at that time and wanting to be restarted on the Effexor.  The depression was situational and due to financial strains.  She took the Effexor for Love few months.  She has not had Effexor since.  Today she reports she is feeling well and very happy and excited about starting school back in the spring.  She currently denies any state of depression.  Denies any thoughts of harming herself or others.    Past Medical History:  Diagnosis Date  . Hypertension     No Known Allergies      Observations/Objective: VITALS: Per patient if applicable, see vitals. GENERAL: Alert, appears well and in no acute distress. HEENT: Atraumatic, conjunctiva clear, no obvious abnormalities on inspection of external nose and ears. NECK: Normal movements of the head and neck. CARDIOPULMONARY: No increased WOB. Speaking in clear sentences. I:E ratio WNL.  MS: Moves all visible extremities without noticeable abnormality. PSYCH: Pleasant and  cooperative, well-groomed. Speech normal rate and rhythm. Affect is appropriate. Insight and judgement are appropriate. Attention is focused, linear, and appropriate.  NEURO: CN grossly intact. Oriented as arrived to appointment on time with no prompting. Moves both UE equally.  SKIN: No obvious lesions, wounds, erythema, or cyanosis noted on face or hands.     Assessment and Plan: PHQ-9 score 0 or negative. Appears in good spirits and not depressed.  She is not at harm to herself or others.     Follow Up Instructions: safe to return back to school.     I discussed the assessment and treatment plan with the patient. The patient was provided an opportunity to ask questions and all were answered. The patient agreed with the plan and demonstrated an understanding of the instructions.   The patient was advised to call back or seek an in-person evaluation if the symptoms worsen or if the condition fails to improve as anticipated.    Christine July, NP  04/09/2019 11:34 AM         Christine July, NP 04/09/19 1200    Christine Halt A, NP 04/09/19 1201

## 2019-04-09 NOTE — Discharge Instructions (Signed)
I feel that it is okay for you to return to school in the spring.

## 2019-04-10 ENCOUNTER — Other Ambulatory Visit: Payer: BC Managed Care – PPO
